# Patient Record
Sex: Male | Born: 2005 | Race: White | Hispanic: No | Marital: Single | Smoking: Never smoker
Health system: Southern US, Community
[De-identification: ages and names within clinical notes are randomized; demographics above are authoritative.]

## PROBLEM LIST (undated history)

## (undated) DIAGNOSIS — J189 Pneumonia, unspecified organism: Secondary | ICD-10-CM

## (undated) HISTORY — PX: NO PAST SURGERIES: SHX2092

---
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
## 2024-02-28 ENCOUNTER — Encounter: Payer: Self-pay | Admitting: Emergency Medicine

## 2024-02-28 ENCOUNTER — Ambulatory Visit (INDEPENDENT_AMBULATORY_CARE_PROVIDER_SITE_OTHER)

## 2024-02-28 ENCOUNTER — Ambulatory Visit
Admission: EM | Admit: 2024-02-28 | Discharge: 2024-02-28 | Disposition: A | Discharge status: Home / Self Care | Attending: Emergency Medicine | Admitting: Emergency Medicine

## 2024-02-28 DIAGNOSIS — J069 Acute upper respiratory infection, unspecified: Secondary | ICD-10-CM

## 2024-02-28 DIAGNOSIS — R051 Acute cough: Secondary | ICD-10-CM | POA: Diagnosis present

## 2024-02-28 LAB — RESP PANEL BY RT-PCR (FLU A&B, COVID) ARPGX2
Influenza A by PCR: NEGATIVE
Influenza B by PCR: NEGATIVE
SARS Coronavirus 2 by RT PCR: NEGATIVE

## 2024-02-28 LAB — GROUP A STREP BY PCR: Group A Strep by PCR: NOT DETECTED

## 2024-02-28 MED ORDER — BENZONATATE 100 MG PO CAPS: 200.0000 mg | ORAL_CAPSULE | Freq: Three times a day (TID) | ORAL | 0 refills | Status: AC

## 2024-02-28 MED ORDER — IPRATROPIUM BROMIDE 0.06 % NA SOLN: 2.0000 | Freq: Four times a day (QID) | NASAL | 12 refills | Status: AC

## 2024-02-28 MED ORDER — PROMETHAZINE-DM 6.25-15 MG/5ML PO SYRP: 5.0000 mL | ORAL_SOLUTION | Freq: Four times a day (QID) | ORAL | 0 refills | Status: AC | PRN

## 2024-02-28 NOTE — Discharge Instructions (Addendum)
 Your chest x-ray did not show any evidence of pneumonia and your respiratory panel was negative for COVID or influenza.  Your strep test was also negative.  I do believe you have a respiratory virus which is causing your symptoms.  Use over-the-counter Tylenol and/or ibuprofen  Corda pack instructions as needed for any fever or pain.  Use the Atrovent nasal spray, 2 squirts in each nostril every 6 hours, as needed for runny nose and postnasal drip.  Use the Tessalon Perles every 8 hours during the day.  Take them with a small sip of water.  They may give you some numbness to the base of your tongue or a metallic taste in your mouth, this is normal.  Use the Promethazine DM cough syrup at bedtime for cough and congestion.  It will make you drowsy so do not take it during the day.  Return for reevaluation or see your primary care provider for any new or worsening symptoms.

## 2024-02-28 NOTE — ED Triage Notes (Signed)
 Patient c/o sore throat, cough, headaches, and runny nose that started on Wed. Patient unsure of fevers.

## 2024-02-28 NOTE — ED Provider Notes (Signed)
 [facility]    CSN: [id number] Arrival date & time: 02/28/24  1213      History   Chief Complaint Chief Complaint  Patient presents with   Cough   Nasal Congestion    HPI Andrew Shaffer is a 18 y.o. male.   HPI  18 year old male with past medical history significant for pneumonia presents for evaluation of 3 days with respiratory symptoms that include headache, runny nose, sore throat, nonproductive cough, & 2 episodes of vomiting.  He denies any fever, shortness breath, or wheezing.  No diarrhea.  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  Past Medical History:  Diagnosis Date   Pneumonia     There are no active problems to display for this patient.   Past Surgical History:  Procedure Laterality Date   NO PAST SURGERIES         Home Medications    Prior to Admission medications   Medication Sig Start Date End Date Taking? Authorizing Provider  benzonatate (TESSALON) 100 MG capsule Take 2 capsules (200 mg total) by mouth every 8 (eight) hours. 02/28/24  Yes Kent Pear, NP  ipratropium (ATROVENT) 0.06 % nasal spray Place 2 sprays into both nostrils 4 (four) times daily. 02/28/24  Yes Kent Pear, NP  promethazine-dextromethorphan (PROMETHAZINE-DM) 6.25-15 MG/5ML syrup Take 5 mLs by mouth 4 (four) times daily as needed. 02/28/24  Yes Kent Pear, NP  ibuprofen  (ADVIL ) 800 MG tablet Take 1 tablet (800 mg total) by mouth 3 (three) times daily. 03/06/22   Reena Canning, NP    Family History History reviewed. No pertinent family history.  Social History Social History   Tobacco Use   Smoking status: Never   Smokeless tobacco: Never  Vaping Use   Vaping status: Never Used  Substance Use Topics   Alcohol use: No   Drug use: No     Allergies   Patient has no known allergies.   Review of Systems Review of Systems  Constitutional:  Negative for fever.  HENT:  Positive for congestion,  rhinorrhea and sore throat. Negative for ear pain.   Respiratory:  Positive for cough. Negative for shortness of breath and wheezing.   Gastrointestinal:  Positive for nausea and vomiting. Negative for diarrhea.  Neurological:  Positive for headaches.     Physical Exam Triage Vital Signs ED Triage Vitals  Encounter Vitals Group     BP      Systolic BP Percentile      Diastolic BP Percentile      Pulse      Resp      Temp      Temp src      SpO2      Weight      Height      Head Circumference      Peak Flow      Pain Score      Pain Loc      Pain Education      Exclude from Growth Chart    No data found.  Updated Vital Signs BP (!) 143/82 (BP Location: Right Arm)   Pulse 60   Temp 98.6 F (37 C) (Oral)   Resp 15   Wt 228 lb (103.4 kg)   SpO2 97%   Visual Acuity Right Eye Distance:   Left Eye Distance:   Bilateral Distance:    Right Eye Near:   Left Eye Near:  Bilateral Near:     Physical Exam Vitals and nursing note reviewed.  Constitutional:      Appearance: Normal appearance. He is not ill-appearing.  HENT:     Head: Normocephalic and atraumatic.     Right Ear: There is impacted cerumen.     Left Ear: There is impacted cerumen.     Ears:     Comments: Both external auditory canals occluded by cerumen.    Nose: Congestion and rhinorrhea present.     Comments: His mucosa is edematous and erythematous with clear discharge in both nares.    Mouth/Throat:     Mouth: Mucous membranes are moist.     Pharynx: Oropharynx is clear. Posterior oropharyngeal erythema present. No oropharyngeal exudate.     Comments: Posterior oropharynx demonstrates erythema with clear postnasal drip. Cardiovascular:     Rate and Rhythm: Normal rate and regular rhythm.     Pulses: Normal pulses.     Heart sounds: Normal heart sounds. No murmur heard.    No friction rub. No gallop.  Pulmonary:     Effort: Pulmonary effort is normal.     Breath sounds: Normal breath sounds.  No wheezing, rhonchi or rales.  Musculoskeletal:     Cervical back: Normal range of motion and neck supple. No tenderness.  Lymphadenopathy:     Cervical: No cervical adenopathy.  Skin:    General: Skin is warm and dry.     Capillary Refill: Capillary refill takes less than 2 seconds.     Findings: No rash.  Neurological:     General: No focal deficit present.     Mental Status: He is alert and oriented to person, place, and time.      UC Treatments / Results  Labs (all labs ordered are listed, but only abnormal results are displayed) Labs Reviewed  RESP PANEL BY RT-PCR (FLU A&B, COVID) [id number]  GROUP A STREP BY PCR    EKG   Radiology DG Chest 2 View Result Date: 02/28/2024 CLINICAL DATA:  Cough for 3 days EXAM: CHEST - 2 VIEW COMPARISON:  None Available. FINDINGS: The heart size and mediastinal contours are within normal limits. No consolidation, pneumothorax or effusion. No edema. The visualized skeletal structures are unremarkable. IMPRESSION: No acute cardiopulmonary disease. Electronically Signed   By: Adrianna Horde M.D.   On: 02/28/2024 13:08    Procedures Procedures (including critical care time)  Medications Ordered in UC Medications - No data to display  Initial Impression / Assessment and Plan / UC Course  I have reviewed the triage vital signs and the nursing notes.  Pertinent labs & imaging results that were available during my care of the patient were reviewed by me and considered in my medical decision making (see chart for details).   Patient is a nontoxic-appearing 18 year old male presenting for evaluation of 3 days with respiratory symptoms to include a cough.  He was exposed to pneumonia though he reports that his cough is currently nonproductive.  In the exam room he is able to speak in full sentence without dyspnea or tachypnea.  The remainder of his physical exam does reveal inflammation of his upper respiratory tract as evidenced by inflamed nasal  mucosa with clear nasal discharge.  Also erythema to the posterior pharynx with clear postnasal drip.  I suspect this postnasal drip is what is causing the patient's sore throat, however I will order a strep PCR.  I will also order a COVID and influenza PCR.  Additionally, given his  exposure to pneumonia, I will order a chest x-ray to evaluate for any acute cardiopulmonary pathology.  Chest x-ray independently reviewed and evaluated by me.  Impression: Lung fields are well aerated without evidence of infiltrate or effusion.  Cardiomediastinal silhouette appears normal.  Radiology overread is pending. Radiology impression states no acute cardiopulmonary pathology.  Strep PCR is negative.  Respiratory panel is negative for COVID influenza.  Will discharge patient with a diagnosis of viral URI with a cough with a prescription for Atrovent nasal spray double nasal congestion.  Tessalon Perles and Promethazine DM cough syrup for cough congestion.  He may use over-the-counter Tylenol and or ibuprofen  as needed for pain.  Return precautions reviewed.   Final Clinical Impressions(s) / UC Diagnoses   Final diagnoses:  Acute cough  Viral URI with cough     Discharge Instructions      Your chest x-ray did not show any evidence of pneumonia and your respiratory panel was negative for COVID or influenza.  Your strep test was also negative.  I do believe you have a respiratory virus which is causing your symptoms.  Use over-the-counter Tylenol and/or ibuprofen  Corda pack instructions as needed for any fever or pain.  Use the Atrovent nasal spray, 2 squirts in each nostril every 6 hours, as needed for runny nose and postnasal drip.  Use the Tessalon Perles every 8 hours during the day.  Take them with a small sip of water.  They may give you some numbness to the base of your tongue or a metallic taste in your mouth, this is normal.  Use the Promethazine DM cough syrup at bedtime for cough and  congestion.  It will make you drowsy so do not take it during the day.  Return for reevaluation or see your primary care provider for any new or worsening symptoms.    ED Prescriptions     Medication Sig Dispense Auth. Provider   benzonatate (TESSALON) 100 MG capsule Take 2 capsules (200 mg total) by mouth every 8 (eight) hours. 21 capsule Kent Pear, NP   ipratropium (ATROVENT) 0.06 % nasal spray Place 2 sprays into both nostrils 4 (four) times daily. 15 mL Kent Pear, NP   promethazine-dextromethorphan (PROMETHAZINE-DM) 6.25-15 MG/5ML syrup Take 5 mLs by mouth 4 (four) times daily as needed. 118 mL Kent Pear, NP      PDMP not reviewed this encounter.   Kent Pear, NP 02/28/24 1320
